# Patient Record
Sex: Male | Born: 1970 | Race: White | Hispanic: No | Marital: Married | Smoking: Never smoker
Health system: Southern US, Community
[De-identification: ages and names within clinical notes are randomized; demographics above are authoritative.]

## PROBLEM LIST (undated history)

## (undated) DIAGNOSIS — E119 Type 2 diabetes mellitus without complications: Secondary | ICD-10-CM

---

## 2009-11-21 ENCOUNTER — Ambulatory Visit: Payer: Self-pay | Admitting: General Practice

## 2009-12-07 ENCOUNTER — Ambulatory Visit: Payer: Self-pay | Admitting: General Practice

## 2010-01-10 ENCOUNTER — Ambulatory Visit: Payer: Self-pay | Admitting: General Practice

## 2010-05-29 ENCOUNTER — Ambulatory Visit: Payer: Self-pay | Admitting: Urology

## 2011-03-22 ENCOUNTER — Ambulatory Visit: Payer: Self-pay | Admitting: Urology

## 2012-02-14 ENCOUNTER — Ambulatory Visit: Payer: Self-pay | Admitting: Podiatry

## 2012-02-14 LAB — CREATININE, SERUM
Creatinine: 1.15 mg/dL (ref 0.60–1.30)
EGFR (African American): >60
EGFR (Non-African Amer.): >60

## 2012-03-23 ENCOUNTER — Ambulatory Visit: Payer: Self-pay | Admitting: Urology

## 2019-03-18 ENCOUNTER — Encounter: Payer: Self-pay | Admitting: Emergency Medicine

## 2019-03-18 ENCOUNTER — Other Ambulatory Visit: Payer: Self-pay

## 2019-03-18 ENCOUNTER — Emergency Department
Admission: EM | Admit: 2019-03-18 | Discharge: 2019-03-18 | Disposition: A | Discharge status: Home / Self Care | Payer: Managed Care, Other (non HMO) | Attending: Emergency Medicine | Admitting: Emergency Medicine

## 2019-03-18 DIAGNOSIS — M25561 Pain in right knee: Secondary | ICD-10-CM

## 2019-03-18 DIAGNOSIS — M2391 Unspecified internal derangement of right knee: Secondary | ICD-10-CM | POA: Diagnosis not present

## 2019-03-18 MED ORDER — MORPHINE SULFATE (PF) 4 MG/ML IV SOLN
4.0000 mg | Freq: Once | INTRAVENOUS | Status: AC
Start: 2019-03-18 — End: 2019-03-18
  Administered 2019-03-18: 4 mg via INTRAMUSCULAR
  Filled 2019-03-18: qty 1

## 2019-03-18 MED ORDER — METHYLPREDNISOLONE SODIUM SUCC 125 MG IJ SOLR
80.0000 mg | Freq: Once | INTRAMUSCULAR | Status: AC
  Administered 2019-03-18: 80 mg via INTRAMUSCULAR
  Filled 2019-03-18: qty 2

## 2019-03-18 MED ORDER — ONDANSETRON 4 MG PO TBDP
4.0000 mg | ORAL_TABLET | Freq: Once | ORAL | Status: AC
  Administered 2019-03-18: 4 mg via ORAL
  Filled 2019-03-18: qty 1

## 2019-03-18 MED ORDER — OXYCODONE-ACETAMINOPHEN 5-325 MG PO TABS: 1.0000 | ORAL_TABLET | ORAL | 0 refills | Status: AC | PRN

## 2019-03-18 NOTE — ED Notes (Signed)
PT here for increasing pain of right knee, not improved by tramadol. Knee appears slightly red and swollen.

## 2019-03-18 NOTE — ED Triage Notes (Signed)
Pt to triage via w/c with no distress noted, mask in place; pt reports injured rt knee few days while exercising; seen at urgent care with neg xrays and rx anti-inflammatories without relief

## 2019-03-18 NOTE — Discharge Instructions (Signed)
1.  Instead of Tramadol, take Percocet as needed for pain. 2.  Continue anti-inflammatory as previously prescribed. 3.  Elevate affected area and apply ice several times daily to reduce swelling. 4.  Return to the ER for worsening symptoms, increased swelling, chest pain, breathing difficulty or other concerns.

## 2019-03-18 NOTE — ED Provider Notes (Signed)
[facility] Emergency Department Provider Note   ____________________________________________   First MD Initiated Contact with Patient 03/18/19 0214     (approximate)  I have reviewed the triage vital signs and the nursing notes.   HISTORY  Chief Complaint Knee Pain    HPI Christopher Crane is a 48 y.o. male who presents to the ED from home with a chief complaint of right knee pain.  Patient was walking/slow jogging several days ago and subsequently noted pain to his right knee.  Seen at urgent care yesterday morning with negative x-rays and prescribed meloxicam and tramadol. Unable to sleep tonight because of the throbbing pain.  Presents to the ED with knee brace and crutches.  Voices no other complaints or injuries.       Past medical history Migraines Nephrolithiasis  There are no active problems to display for this patient.   Past surgical history Colonoscopy  Prior to Admission medications   Medication Sig Start Date End Date Taking? Authorizing Provider  oxyCODONE-acetaminophen (PERCOCET/ROXICET) 5-325 MG tablet Take 1 tablet by mouth every 4 (four) hours as needed for severe pain. 03/18/19   Irean Hong, MD    Allergies Patient has no known allergies.  No family history on file.  Social History Social History   Tobacco Use  . Smoking status: Never Smoker  . Smokeless tobacco: Never Used  Substance Use Topics  . Alcohol use: Not on file  . Drug use: Not on file    Review of Systems  Constitutional: No fever/chills Eyes: No visual changes. ENT: No sore throat. Cardiovascular: Denies chest pain. Respiratory: Denies shortness of breath. Gastrointestinal: No abdominal pain.  No nausea, no vomiting.  No diarrhea.  No constipation. Genitourinary: Negative for dysuria. Musculoskeletal: Positive for right knee pain.  Negative for back pain. Skin: Negative for rash. Neurological: Negative for headaches, focal weakness or  numbness.   ____________________________________________   PHYSICAL EXAM:  VITAL SIGNS: ED Triage Vitals  Enc Vitals Group     BP 03/18/19 0101 (!) 156/88     Pulse Rate 03/18/19 0101 95     Resp 03/18/19 0101 18     Temp 03/18/19 0101 98.7 F (37.1 C)     Temp Source 03/18/19 0101 Oral     SpO2 03/18/19 0101 92 %     Weight 03/18/19 0100 270 lb (122.5 kg)     Height 03/18/19 0100 6\' 2"  (1.88 m)     Head Circumference --      Peak Flow --      Pain Score 03/18/19 0100 8     Pain Loc --      Pain Edu? --      Excl. in GC? --     Constitutional: Alert and oriented. Well appearing and in mild acute distress. Eyes: Conjunctivae are normal. PERRL. EOMI. Head: Atraumatic. Nose: No congestion/rhinnorhea. Mouth/Throat: Mucous membranes are moist.  Oropharynx non-erythematous. Neck: No stridor.  No cervical spine tenderness to palpation. Cardiovascular: Normal rate, regular rhythm. Grossly normal heart sounds.  Good peripheral circulation. Respiratory: Normal respiratory effort.  No retractions. Lungs CTAB. Gastrointestinal: Soft and nontender. No distention. No abdominal bruits. No CVA tenderness. Musculoskeletal: Right knee with mild to moderate swelling and small joint effusion.  Tender to palpation particularly on the lateral aspect.  Decreased range of motion secondary to pain.  2+ distal pulses. Neurologic:  Normal speech and language. No gross focal neurologic deficits are appreciated.  Skin:  Skin is warm, dry  and intact. No rash noted. Psychiatric: Mood and affect are normal. Speech and behavior are normal.  ____________________________________________   LABS (all labs ordered are listed, but only abnormal results are displayed)  Labs Reviewed - No data to display ____________________________________________  EKG  None ____________________________________________  RADIOLOGY  ED MD interpretation: None  Official radiology report(s): No results found.   ____________________________________________   PROCEDURES  Procedure(s) performed (including Critical Care):  Procedures   ____________________________________________   INITIAL IMPRESSION / ASSESSMENT AND PLAN / ED COURSE  As part of my medical decision making, I reviewed the following data within the [place] History obtained from family, Nursing notes reviewed and incorporated, Old chart reviewed, Notes from prior ED visits and [place] Controlled Substance [name] was evaluated in Emergency Department on 03/18/2019 for the symptoms described in the history of present illness. He was evaluated in the context of the global COVID-19 pandemic, which necessitated consideration that the patient might be at risk for infection with the SARS-CoV-2 virus that causes COVID-19. Institutional protocols and algorithms that pertain to the evaluation of patients at risk for COVID-19 are in a state of rapid change based on information released by regulatory bodies including the CDC and federal and state organizations. These policies and algorithms were followed during the patient's care in the ED.    48 year old male who presents with internal derangement of right knee.  Will change tramadol to Percocet, single dose IM steroid and morphine here.  Will refer to orthopedics for follow-up.  Strict return precautions given.  Patient and spouse verbalized understanding and agree with plan of care.      ____________________________________________   FINAL CLINICAL IMPRESSION(S) / ED DIAGNOSES  Final diagnoses:  Acute pain of right knee  Internal derangement of right knee     ED Discharge Orders         Ordered    oxyCODONE-acetaminophen (PERCOCET/ROXICET) 5-325 MG tablet  Every 4 hours PRN     03/18/19 0224           Note:  This document was prepared using Dragon voice recognition software and may include unintentional dictation errors.   Paulette Blanch, MD 03/18/19 [phone]

## 2019-08-08 ENCOUNTER — Other Ambulatory Visit: Payer: Self-pay

## 2019-08-08 ENCOUNTER — Encounter: Payer: Self-pay | Admitting: Emergency Medicine

## 2019-08-08 ENCOUNTER — Ambulatory Visit
Admission: EM | Admit: 2019-08-08 | Discharge: 2019-08-08 | Disposition: A | Discharge status: Home / Self Care | Payer: Managed Care, Other (non HMO) | Attending: Family Medicine | Admitting: Family Medicine

## 2019-08-08 DIAGNOSIS — E119 Type 2 diabetes mellitus without complications: Secondary | ICD-10-CM

## 2019-08-08 DIAGNOSIS — R350 Frequency of micturition: Secondary | ICD-10-CM | POA: Diagnosis not present

## 2019-08-08 DIAGNOSIS — R631 Polydipsia: Secondary | ICD-10-CM | POA: Diagnosis not present

## 2019-08-08 HISTORY — DX: Type 2 diabetes mellitus without complications: E11.9

## 2019-08-08 LAB — URINALYSIS, COMPLETE (UACMP) WITH MICROSCOPIC
Bacteria, UA: NONE SEEN
Bilirubin Urine: NEGATIVE
Glucose, UA: >1000 mg/dL — AB
Ketones, ur: 15 mg/dL — AB
Leukocytes,Ua: NEGATIVE
Nitrite: NEGATIVE
Protein, ur: NEGATIVE mg/dL
Specific Gravity, Urine: 1.02 (ref 1.005–1.030)
Squamous Epithelial / HPF: NONE SEEN (ref 0–5)
WBC, UA: NONE SEEN WBC/hpf (ref 0–5)
pH: 5.5 (ref 5.0–8.0)

## 2019-08-08 LAB — COMPREHENSIVE METABOLIC PANEL
ALT: 63 U/L — ABNORMAL HIGH (ref 0–44)
AST: 59 U/L — ABNORMAL HIGH (ref 15–41)
Albumin: 4 g/dL (ref 3.5–5.0)
Alkaline Phosphatase: 103 U/L (ref 38–126)
Anion gap: 7 (ref 5–15)
BUN: 29 mg/dL — ABNORMAL HIGH (ref 6–20)
CO2: 26 mmol/L (ref 22–32)
Calcium: 9.2 mg/dL (ref 8.9–10.3)
Chloride: 95 mmol/L — ABNORMAL LOW (ref 98–111)
Creatinine, Ser: 1.25 mg/dL — ABNORMAL HIGH (ref 0.61–1.24)
GFR calc Af Amer: >60 mL/min (ref >60)
GFR calc non Af Amer: >60 mL/min (ref >60)
Glucose, Bld: 379 mg/dL — ABNORMAL HIGH (ref 70–99)
Potassium: 4.5 mmol/L (ref 3.5–5.1)
Sodium: 128 mmol/L — ABNORMAL LOW (ref 135–145)
Total Bilirubin: 1.3 mg/dL — ABNORMAL HIGH (ref 0.3–1.2)
Total Protein: 7 g/dL (ref 6.5–8.1)

## 2019-08-08 LAB — GLUCOSE, CAPILLARY: Glucose-Capillary: 331 mg/dL — ABNORMAL HIGH (ref 70–99)

## 2019-08-08 MED ORDER — METFORMIN HCL 500 MG PO TABS: 500.0000 mg | ORAL_TABLET | Freq: Every day | ORAL | 0 refills | Status: AC

## 2019-08-08 NOTE — Discharge Instructions (Signed)
Increase water intake Change diet Follow up with Primary Care provider early this week

## 2019-08-08 NOTE — ED Triage Notes (Signed)
Patient c/o confusion, urinary frequency and increase in thirst that started last night.  Patient was currently on Prednisone.  Patient states that he stopped taking his Prednisone yesterday.  Patient was being treated for a sinusitis.  Patient has history of diabetes but is not on medication for diabetes.  Patient states that his BG was around 400 about 1 hour ago.

## 2019-08-08 NOTE — ED Provider Notes (Signed)
[facility]    CSN: [id number] Arrival date & time: 08/08/19  1508      History   Chief Complaint Chief Complaint  Patient presents with  . Urinary Frequency  . Altered Mental Status  . Hyperglycemia    HPI Christopher Crane is a 49 y.o. male.   49 yo male with a c/o frequent urination, increased thirst since yesterday and "feeling head foggy" today. States he has a h/o borderline high blood sugars in the past and family history of diabetes. Also recently started taking prednisone and an antibiotic for a sinusitis. States he has not been careful with his diet and has been gaining more weight recently. Denies any chest pains, shortness of breath, numbness/tingling, unilateral weakness, syncope.    Urinary Frequency  Altered Mental Status Hyperglycemia Associated symptoms: altered mental status     Past Medical History:  Diagnosis Date  . Diabetes mellitus without complication (HCC)     There are no problems to display for this patient.   Past Surgical History:  Procedure Laterality Date  . FRACTURE SURGERY         Home Medications    Prior to Admission medications   Medication Sig Start Date End Date Taking? Authorizing Provider  amoxicillin-clavulanate (AUGMENTIN) 875-125 MG tablet Take by mouth. 08/04/19 08/14/19 Yes [provider]  benzonatate (TESSALON) 200 MG capsule Take by mouth. 08/04/19 08/11/19 Yes [provider]  Cholecalciferol 25 MCG (1000 UT) capsule Take by mouth.   Yes [provider]  predniSONE (DELTASONE) 20 MG tablet Take by mouth. 08/04/19 08/09/19 Yes [provider]  metFORMIN (GLUCOPHAGE) 500 MG tablet Take 1 tablet (500 mg total) by mouth daily. 08/08/19   Payton Mccallum, MD  oxyCODONE-acetaminophen (PERCOCET/ROXICET) 5-325 MG tablet Take 1 tablet by mouth every 4 (four) hours as needed for severe pain. 03/18/19   Irean Hong, MD    Family History Family History  Problem Relation Age of  Onset  . Healthy Mother   . Diabetes Father     Social History Social History   Tobacco Use  . Smoking status: Never Smoker  . Smokeless tobacco: Never Used  Substance Use Topics  . Alcohol use: Yes  . Drug use: Never     Allergies   Patient has no known allergies.   Review of Systems Review of Systems  Genitourinary: Positive for frequency.     Physical Exam Triage Vital Signs ED Triage Vitals  Enc Vitals Group     BP 08/08/19 1528 (S) (!) 170/95     Pulse Rate 08/08/19 1528 90     Resp 08/08/19 1528 16     Temp 08/08/19 1528 98.2 F (36.8 C)     Temp Source 08/08/19 1528 Oral     SpO2 08/08/19 1528 97 %     Weight 08/08/19 1523 250 lb (113.4 kg)     Height 08/08/19 1523 6\' 2"  (1.88 m)     Head Circumference --      Peak Flow --      Pain Score 08/08/19 1522 2     Pain Loc --      Pain Edu? --      Excl. in GC? --    No data found.  Updated Vital Signs BP (!) 156/90 (BP Location: Right Arm)   Pulse 90   Temp 98.2 F (36.8 C) (Oral)   Resp 16   Ht 6\' 2"  (1.88 m)   Wt 113.4 kg  SpO2 97%   BMI 32.10 kg/m   Visual Acuity Right Eye Distance:   Left Eye Distance:   Bilateral Distance:    Right Eye Near:   Left Eye Near:    Bilateral Near:     Physical Exam Vitals and nursing note reviewed.  Constitutional:      General: He is not in acute distress.    Appearance: He is not toxic-appearing or diaphoretic.  Cardiovascular:     Rate and Rhythm: Normal rate.     Heart sounds: Normal heart sounds.  Pulmonary:     Effort: Pulmonary effort is normal. No respiratory distress.     Breath sounds: Normal breath sounds.  Neurological:     General: No focal deficit present.     Mental Status: He is alert and oriented to person, place, and time.  Psychiatric:        Thought Content: Thought content normal.      UC Treatments / Results  Labs (all labs ordered are listed, but only abnormal results are displayed) Labs Reviewed  COMPREHENSIVE  METABOLIC PANEL - Abnormal; Notable for the following components:      Result Value   Sodium 128 (*)    Chloride 95 (*)    Glucose, Bld 379 (*)    BUN 29 (*)    Creatinine, Ser 1.25 (*)    AST 59 (*)    ALT 63 (*)    Total Bilirubin 1.3 (*)    All other components within normal limits  GLUCOSE, CAPILLARY - Abnormal; Notable for the following components:   Glucose-Capillary 331 (*)    All other components within normal limits  URINALYSIS, COMPLETE (UACMP) WITH MICROSCOPIC - Abnormal; Notable for the following components:   Glucose, UA >1,000 (*)    Hgb urine dipstick TRACE (*)    Ketones, ur 15 (*)    All other components within normal limits  CBG MONITORING, ED    EKG   Radiology No results found.  Procedures Procedures (including critical care time)  Medications Ordered in UC Medications - No data to display  Initial Impression / Assessment and Plan / UC Course  I have reviewed the triage vital signs and the nursing notes.  Pertinent labs & imaging results that were available during my care of the patient were reviewed by me and considered in my medical decision making (see chart for details).      Final Clinical Impressions(s) / UC Diagnoses   Final diagnoses:  Newly diagnosed diabetes [facility])     Discharge Instructions     Increase water intake Change diet Follow up with Primary Care provider early this week    ED Prescriptions    Medication Sig Dispense Auth. Provider   metFORMIN (GLUCOPHAGE) 500 MG tablet Take 1 tablet (500 mg total) by mouth daily. 10 tablet Norval Gable, MD      1. Lab results and diagnosis reviewed with patient 2. rx as per orders above; reviewed possible side effects, interactions, risks and benefits  3. Recommend supportive treatment with as above; stop prednisone 4. Follow up with PCP this week 5. Go to Emergency Department if symptoms worsen 6. Follow-up prn    PDMP not reviewed this encounter.   Norval Gable,  MD 08/08/19 [phone]

## 2019-09-12 ENCOUNTER — Ambulatory Visit: Payer: Managed Care, Other (non HMO) | Attending: Internal Medicine

## 2019-09-12 DIAGNOSIS — Z23 Encounter for immunization: Secondary | ICD-10-CM

## 2019-09-12 NOTE — Progress Notes (Signed)
   Covid-19 Vaccination Clinic  Name:  Christopher Crane    MRN: [id number] DOB: 1971-04-15  09/12/2019  [name] was observed post Covid-19 immunization for 15 minutes without incident. He was provided with Vaccine Information Sheet and instruction to access the V-Safe system.   [name] was instructed to call 911 with any severe reactions post vaccine: Marland Kitchen Difficulty breathing  . Swelling of face and throat  . A fast heartbeat  . A bad rash all over body  . Dizziness and weakness   Immunizations Administered    Name Date Dose VIS Date Route   Pfizer COVID-19 Vaccine 09/12/2019 10:01 AM 0.3 mL 05/28/2019 Intramuscular   Manufacturer: [employer], [employer]   Lot: [id number]   NDC: [address]-3

## 2019-10-05 ENCOUNTER — Ambulatory Visit: Payer: Managed Care, Other (non HMO) | Attending: Internal Medicine

## 2019-10-05 DIAGNOSIS — Z23 Encounter for immunization: Secondary | ICD-10-CM

## 2019-10-05 NOTE — Progress Notes (Signed)
   Covid-19 Vaccination Clinic  Name:  Christopher Crane    MRN: [id number] DOB: Jul 26, 1970  10/05/2019  Mr. [name] was observed post Covid-19 immunization for 15 minutes without incident. He was provided with Vaccine Information Sheet and instruction to access the V-Safe system.   Mr. [name] was instructed to call 911 with any severe reactions post vaccine: Marland Kitchen Difficulty breathing  . Swelling of face and throat  . A fast heartbeat  . A bad rash all over body  . Dizziness and weakness   Immunizations Administered    Name Date Dose VIS Date Route   Pfizer COVID-19 Vaccine 10/05/2019 11:57 AM 0.3 mL 08/11/2018 Intramuscular   Manufacturer: [employer], [employer]   Lot: [id number]   NDC: [address]-4

## 2020-07-19 ENCOUNTER — Emergency Department
Admission: EM | Admit: 2020-07-19 | Discharge: 2020-07-19 | Disposition: A | Discharge status: Home / Self Care | Payer: Managed Care, Other (non HMO) | Attending: Emergency Medicine | Admitting: Emergency Medicine

## 2020-07-19 ENCOUNTER — Other Ambulatory Visit: Payer: Self-pay

## 2020-07-19 ENCOUNTER — Emergency Department: Payer: Managed Care, Other (non HMO)

## 2020-07-19 DIAGNOSIS — E119 Type 2 diabetes mellitus without complications: Secondary | ICD-10-CM | POA: Insufficient documentation

## 2020-07-19 DIAGNOSIS — Z7984 Long term (current) use of oral hypoglycemic drugs: Secondary | ICD-10-CM | POA: Diagnosis not present

## 2020-07-19 DIAGNOSIS — N200 Calculus of kidney: Secondary | ICD-10-CM | POA: Insufficient documentation

## 2020-07-19 DIAGNOSIS — R109 Unspecified abdominal pain: Secondary | ICD-10-CM

## 2020-07-19 LAB — CBC
HCT: 45 % (ref 39.0–52.0)
Hemoglobin: 15.5 g/dL (ref 13.0–17.0)
MCH: 30.6 pg (ref 26.0–34.0)
MCHC: 34.4 g/dL (ref 30.0–36.0)
MCV: 88.8 fL (ref 80.0–100.0)
Platelets: 247 10*3/uL (ref 150–400)
RBC: 5.07 MIL/uL (ref 4.22–5.81)
RDW: 12.7 % (ref 11.5–15.5)
WBC: 8.6 10*3/uL (ref 4.0–10.5)
nRBC: 0 % (ref 0.0–0.2)

## 2020-07-19 LAB — BASIC METABOLIC PANEL
Anion gap: 11 (ref 5–15)
BUN: 18 mg/dL (ref 6–20)
CO2: 25 mmol/L (ref 22–32)
Calcium: 9.7 mg/dL (ref 8.9–10.3)
Chloride: 103 mmol/L (ref 98–111)
Creatinine, Ser: 0.91 mg/dL (ref 0.61–1.24)
GFR, Estimated: >60 mL/min (ref >60)
Glucose, Bld: 134 mg/dL — ABNORMAL HIGH (ref 70–99)
Potassium: 4.2 mmol/L (ref 3.5–5.1)
Sodium: 139 mmol/L (ref 135–145)

## 2020-07-19 LAB — URINALYSIS, COMPLETE (UACMP) WITH MICROSCOPIC
Bacteria, UA: NONE SEEN
Bilirubin Urine: NEGATIVE
Glucose, UA: NEGATIVE mg/dL
Ketones, ur: NEGATIVE mg/dL
Leukocytes,Ua: NEGATIVE
Nitrite: NEGATIVE
Protein, ur: NEGATIVE mg/dL
Specific Gravity, Urine: 1.018 (ref 1.005–1.030)
Squamous Epithelial / HPF: NONE SEEN (ref 0–5)
pH: 5 (ref 5.0–8.0)

## 2020-07-19 LAB — HEPATIC FUNCTION PANEL
ALT: 36 U/L (ref 0–44)
AST: 29 U/L (ref 15–41)
Albumin: 4.5 g/dL (ref 3.5–5.0)
Alkaline Phosphatase: 77 U/L (ref 38–126)
Bilirubin, Direct: <0.1 mg/dL (ref 0.0–0.2)
Total Bilirubin: 0.7 mg/dL (ref 0.3–1.2)
Total Protein: 7.9 g/dL (ref 6.5–8.1)

## 2020-07-19 LAB — LIPASE, BLOOD: Lipase: 32 U/L (ref 11–51)

## 2020-07-19 MED ORDER — CYCLOBENZAPRINE HCL 5 MG PO TABS: 5.0000 mg | ORAL_TABLET | Freq: Three times a day (TID) | ORAL | 0 refills | Status: AC | PRN

## 2020-07-19 NOTE — ED Provider Notes (Signed)
[facility] Emergency Department Provider Note  Time seen: 4:10 PM  I have reviewed the triage vital signs and the nursing notes.   HISTORY  Chief Complaint Flank Pain and Groin Pain   HPI TREVELLE MCGURN is a 50 y.o. male with a past medical history of diabetes, presents emergency department for right flank pain.  According to the patient over the past 2 days he has been experiencing an intermittent sharp pain in his right flank and right lower quadrant.  Patient states the pain is not constant but tends to be worse when he is sitting up and relieved by lying down/stretching out the area.  States 3 to 4 days ago he was doing heavy lifting and hurt his back, states this could be related but he is not sure.  Also states a history of kidney stones, but denies any dysuria or hematuria.  No nausea vomiting or diarrhea.  No fever.  No cough congestion or shortness of breath.   Past Medical History:  Diagnosis Date  . Diabetes mellitus without complication (HCC)     There are no problems to display for this patient.   Past Surgical History:  Procedure Laterality Date  . FRACTURE SURGERY      Prior to Admission medications   Medication Sig Start Date End Date Taking? Authorizing Provider  Cholecalciferol 25 MCG (1000 UT) capsule Take by mouth.    [provider]  metFORMIN (GLUCOPHAGE) 500 MG tablet Take 1 tablet (500 mg total) by mouth daily. 08/08/19   Payton Mccallum, MD  oxyCODONE-acetaminophen (PERCOCET/ROXICET) 5-325 MG tablet Take 1 tablet by mouth every 4 (four) hours as needed for severe pain. 03/18/19   Irean Hong, MD    No Known Allergies  Family History  Problem Relation Age of Onset  . Healthy Mother   . Diabetes Father     Social History Social History   Tobacco Use  . Smoking status: Never Smoker  . Smokeless tobacco: Never Used  Vaping Use  . Vaping Use: Never used  Substance Use Topics  . Alcohol use: Yes  . Drug use:  Never    Review of Systems Constitutional: Negative for fever. Cardiovascular: Negative for chest pain. Respiratory: Negative for shortness of breath. Gastrointestinal: Sharp intermittent right flank/right lower quadrant abdominal pain.  Negative nausea vomiting or diarrhea Genitourinary: Negative for urinary compaints Musculoskeletal: Negative for musculoskeletal complaints Neurological: Negative for headache All other ROS negative  ____________________________________________   PHYSICAL EXAM:  VITAL SIGNS: ED Triage Vitals [07/19/20 1529]  Enc Vitals Group     BP (!) 177/105     Pulse Rate 95     Resp 18     Temp 98.5 F (36.9 C)     Temp Source Oral     SpO2 96 %     Weight 260 lb (117.9 kg)     Height 6\' 2"  (1.88 m)     Head Circumference      Peak Flow      Pain Score 8     Pain Loc      Pain Edu?      Excl. in GC?     Constitutional: Alert and oriented. Well appearing and in no distress. Eyes: Normal exam ENT      Head: Normocephalic and atraumatic.      Mouth/Throat: Mucous membranes are moist. Cardiovascular: Normal rate, regular rhythm. Respiratory: Normal respiratory effort without tachypnea nor retractions. Breath sounds are clear Gastrointestinal: Soft and nontender.  No distention.  Musculoskeletal: Nontender with normal range of motion in all extremities. Neurologic:  Normal speech and language. No gross focal neurologic deficits  Skin:  Skin is warm, dry and intact.  Psychiatric: Mood and affect are normal.   ____________________________________________    RADIOLOGY  CT scan shows renal calculi without ureteral calculi.  Mild haziness of the upper mesentery adjacent to the pancreas.  ____________________________________________   INITIAL IMPRESSION / ASSESSMENT AND PLAN / ED COURSE  Pertinent labs & imaging results that were available during my care of the patient were reviewed by me and considered in my medical decision making (see chart  for details).   Patient presents to the emergency department for right flank pain intermittent over the past 2 days.  Overall the patient appears well, no distress.  Benign abdominal exam with no tenderness elicited.  No CVA tenderness.  Patient's lab work is largely within normal limits, urinalysis is pending.  Given the patient's intermittent right flank pain with a history of kidney stones we will proceed with CT imaging to rule out ureterolithiasis.  CT shows no significant findings besides possible mild haziness abutting the pancreas.  Lipase is normal.  Hepatic function panel normal.  Patient has no pain in the upper abdomen all of his discomfort is in the right lower abdomen around the inguinal canal and wrapping around to his back.  Given the patient's negative CT reassuring lab work reassuring vitals I believe the patient is safe for discharge home with PCP follow-up.  As the patient feels better when stretched out and worse when bent however could very likely be musculoskeletal in nature.  We will place patient on Flexeril and have him follow-up with his PCP.  Patient agreeable to plan of care.  Blaize Epple Jasso was evaluated in Emergency Department on 07/19/2020 for the symptoms described in the history of present illness. He was evaluated in the context of the global COVID-19 pandemic, which necessitated consideration that the patient might be at risk for infection with the SARS-CoV-2 virus that causes COVID-19. Institutional protocols and algorithms that pertain to the evaluation of patients at risk for COVID-19 are in a state of rapid change based on information released by regulatory bodies including the CDC and federal and state organizations. These policies and algorithms were followed during the patient's care in the ED.  ____________________________________________   FINAL CLINICAL IMPRESSION(S) / ED DIAGNOSES  Right flank pain   Minna Antis, MD 07/19/20 1740

## 2020-07-19 NOTE — ED Notes (Signed)
Pt presents to ED with c/o of intermittent R groin area. Pt states he noticed it last night when he couldn't get comfortable in bed. Pt states a HX of kidney stones. Pt also states low back pain that started Saturday that pt states happened after he moved a washing machine. Pt denies N/V/D. Pt denies fevers or chills. Pt states able to urinate without issue but states "Its hard to get started". Pt denies burning on urination or increased frequency. Pt is A&Ox4.

## 2020-07-19 NOTE — ED Notes (Signed)
Patient transported to CT 

## 2020-07-19 NOTE — ED Triage Notes (Signed)
Pt comes pov from [facility] with from groin pain radiating to the right flank area. Hx of stones. Started last night. Pt was also lifting heavy things recently. Also states that the groin pain feels better when stretched.

## 2021-07-11 IMAGING — CT CT RENAL STONE PROTOCOL
2 of 4 series · 15 of 46 positions shown, 17 images · non-contrast
Comparison: CT abdomen pelvis dated 01/10/2010.

CLINICAL DATA: 49-year-old male with right flank pain. Concern for
kidney stone.

EXAM:
CT ABDOMEN AND PELVIS WITHOUT CONTRAST
TECHNIQUE: Multidetector CT imaging of the abdomen and pelvis was performed
following the standard protocol without IV contrast.

[Series 2: stone full standard · axial · 0.78mm/px · z∈[-1054,-600]mm · 12 of 105 slices shown, 14 images]
[im 9/105  soft-tissue]
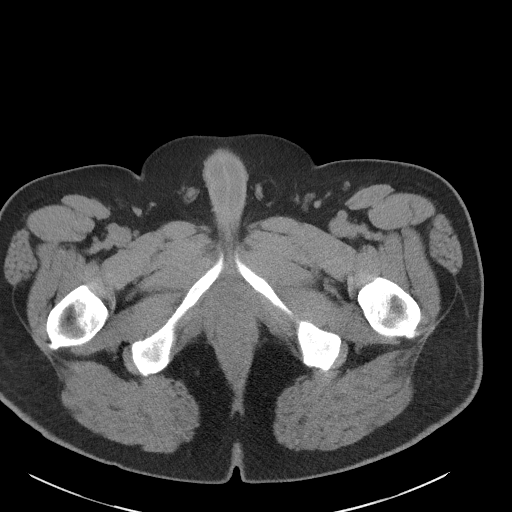
[im 9/105  bone]
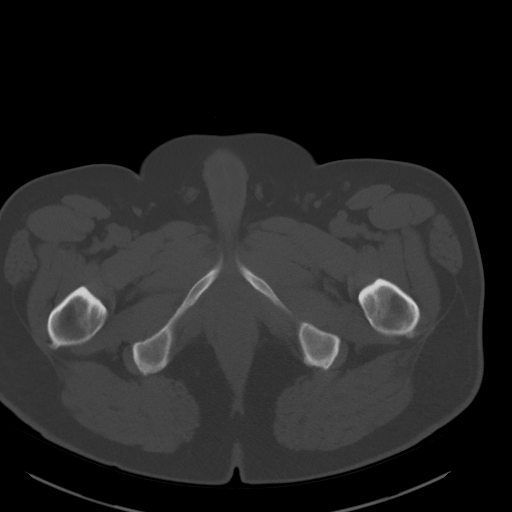
[im 17/105  soft-tissue]
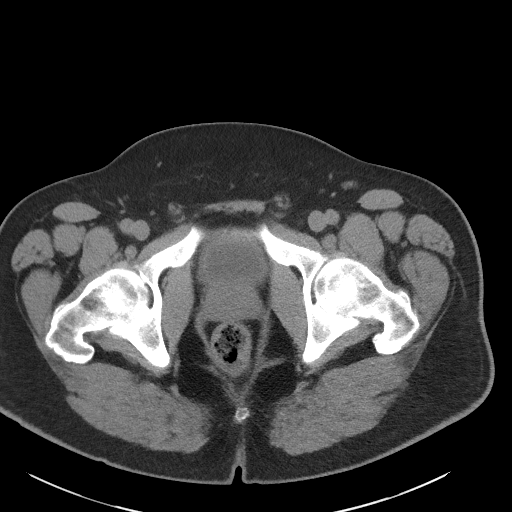
[im 25/105  soft-tissue]
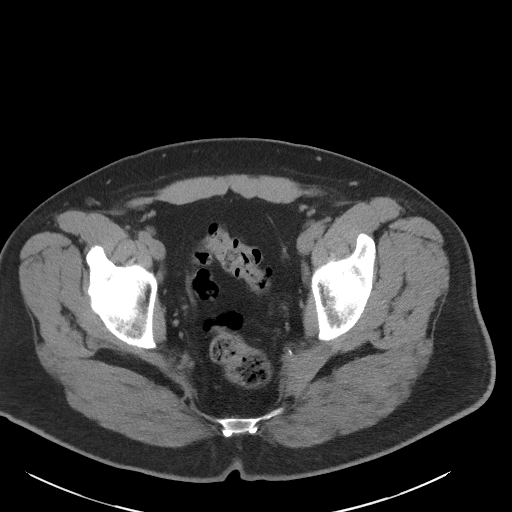
[im 34/105  soft-tissue]
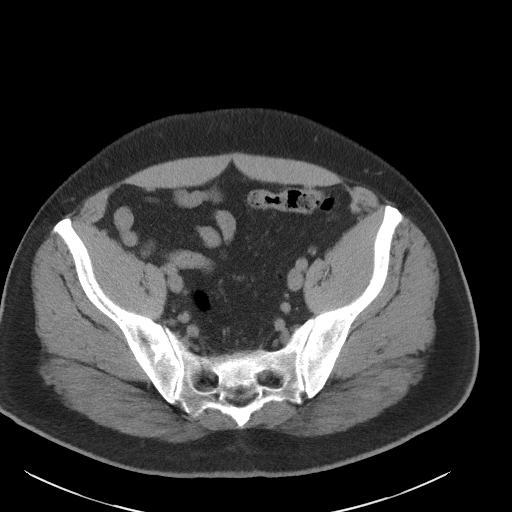
[im 42/105  soft-tissue]
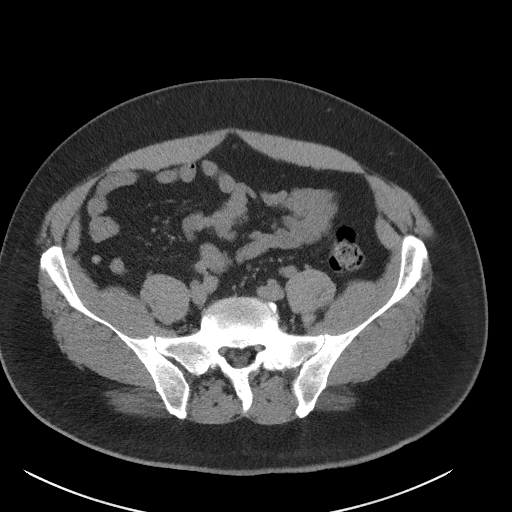
[im 50/105  soft-tissue]
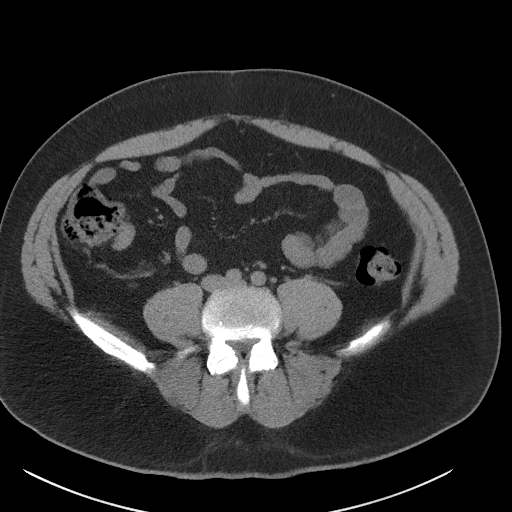
[im 59/105  soft-tissue]
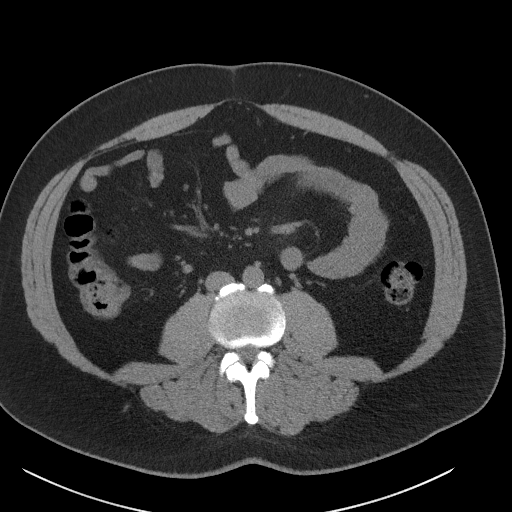
[im 67/105  soft-tissue]
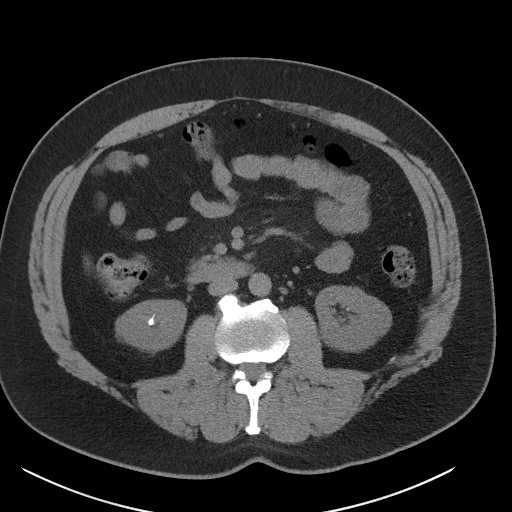
[im 75/105  soft-tissue]
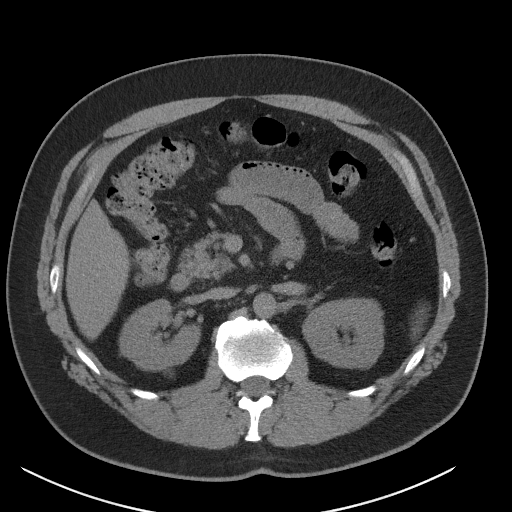
[im 75/105  bone]
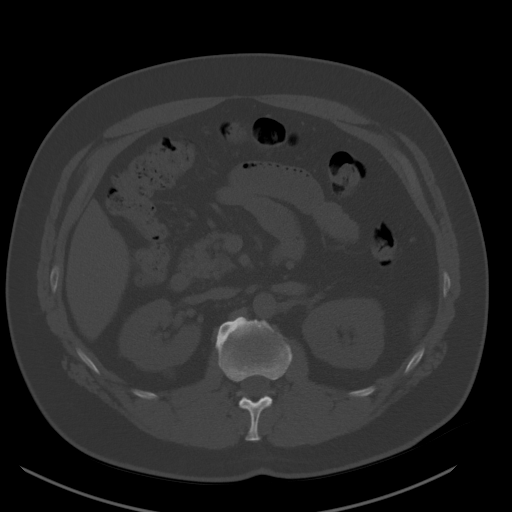
[im 84/105  soft-tissue]
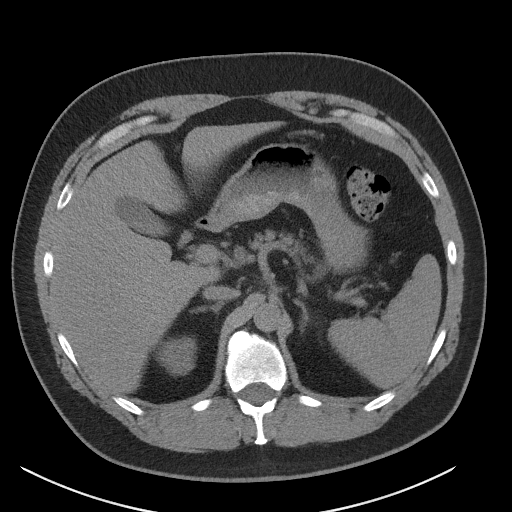
[im 92/105  soft-tissue]
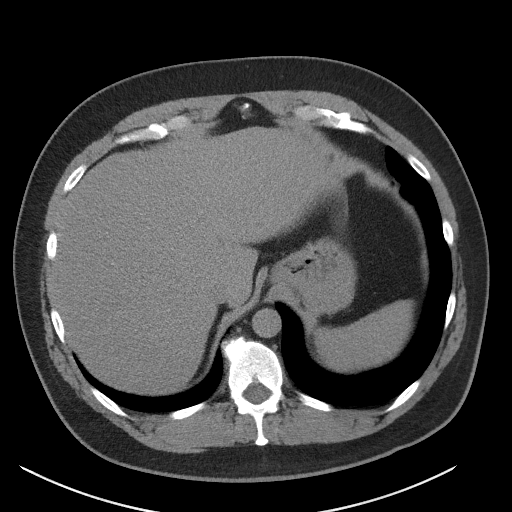
[im 100/105  soft-tissue]
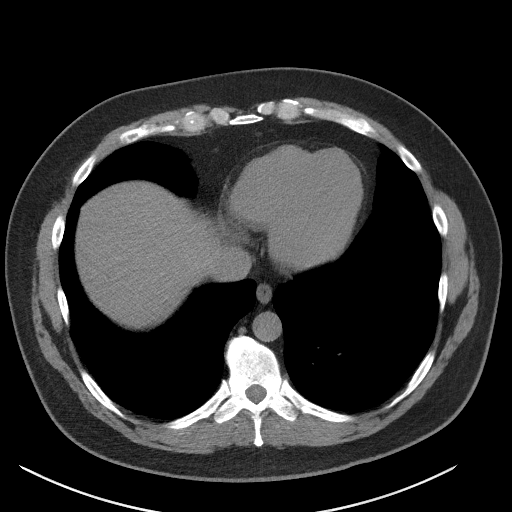

[Series 5: coronal · coronal · 0.80mm/px · 3 of 169 slices shown]
[im 57/169  soft-tissue]
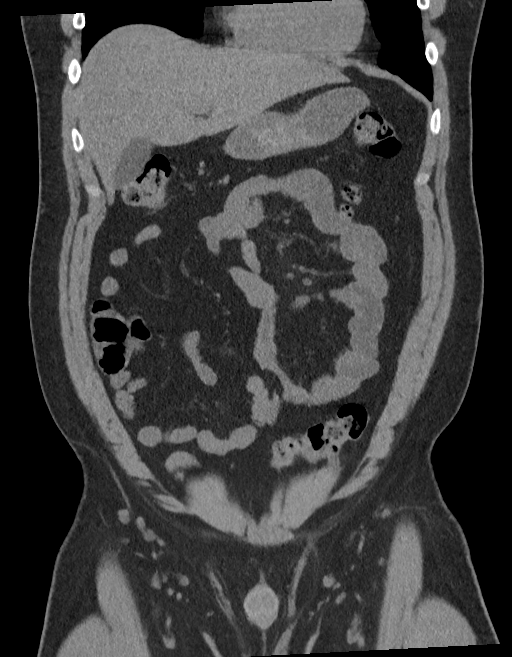
[im 75/169  soft-tissue]
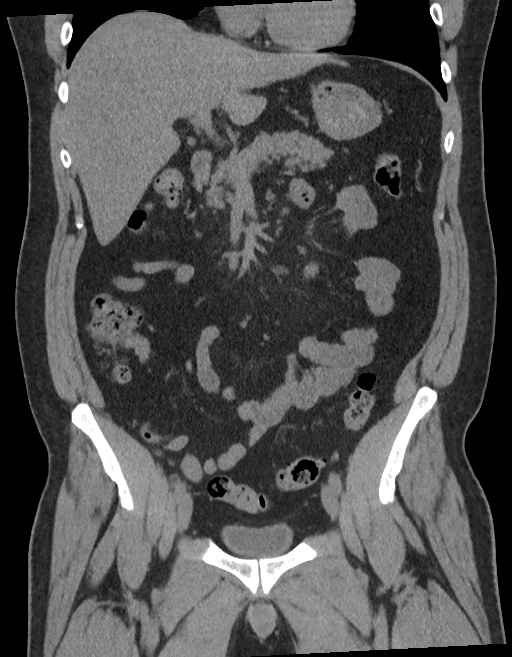
[im 94/169  soft-tissue]
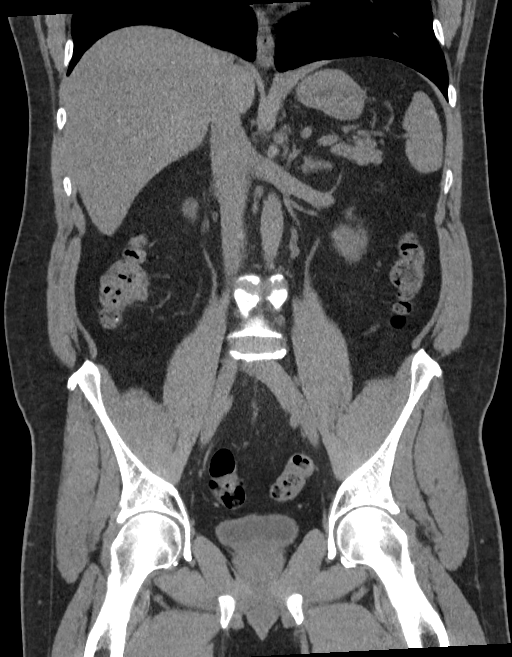

[15 of 46 positions shown; findings below may reference images not displayed]

FINDINGS: Evaluation of this exam is limited in the absence of intravenous
contrast.

The visualized lung bases are clear.

No intra-abdominal free air or free fluid. Lower chest: Fatty liver.
No intrahepatic biliary dilatation. Gallbladder is unremarkable.

Hepatobiliary: No focal liver abnormality is seen. No gallstones,
gallbladder wall thickening, or biliary dilatation.

Pancreas: Mild haziness of the upper mesentery and adjacent to the
uncinate process of the pancreas. Correlation with pancreatic
enzymes recommended to exclude pancreatitis. No dilatation of the
main pancreatic duct or gland atrophy.

Spleen: Normal in size without focal abnormality.

Adrenals/Urinary Tract: The adrenal glands unremarkable. Multiple
nonobstructing bilateral renal calculi measure up to 8 mm in the
interpolar left kidney and 10 mm in the lower pole of the right
kidney. There is no hydronephrosis on either side. A 9 mm exophytic
hypodense lesion from the posterior interpolar right kidney is
suboptimally characterized on this noncontrast CT but demonstrates
fluid attenuation, likely a cyst. The visualized ureters and urinary
bladder appear unremarkable.

Stomach/Bowel: There is sigmoid diverticulosis without active
inflammatory changes. There is no bowel obstruction or active
inflammation. The appendix is normal.

Vascular/Lymphatic: The abdominal aorta and IVC unremarkable. No
portal venous gas. There is no adenopathy.

Reproductive: The prostate and seminal vesicles are grossly
unremarkable. No pelvic mass.

Other: Mild haziness of the upper mesentery, nonspecific.

Musculoskeletal: Degenerative changes of the spine. No acute osseous
pathology.
IMPRESSION: 1. Nonobstructing bilateral renal calculi. No hydronephrosis.
2. Mild haziness of the upper mesentery and adjacent to the uncinate
process of the pancreas. Correlation with pancreatic enzymes
recommended to exclude pancreatitis.
3. Fatty liver.
4. Sigmoid diverticulosis. No bowel obstruction. Normal appendix.

## 2022-12-30 ENCOUNTER — Ambulatory Visit: Payer: Managed Care, Other (non HMO)

## 2022-12-30 DIAGNOSIS — Z1211 Encounter for screening for malignant neoplasm of colon: Secondary | ICD-10-CM

## 2022-12-30 DIAGNOSIS — Z83719 Family history of colon polyps, unspecified: Secondary | ICD-10-CM

## 2022-12-30 DIAGNOSIS — K5730 Diverticulosis of large intestine without perforation or abscess without bleeding: Secondary | ICD-10-CM

## 2022-12-30 DIAGNOSIS — D124 Benign neoplasm of descending colon: Secondary | ICD-10-CM
